# Patient Record
Sex: Female | Born: 1955 | Smoking: Never smoker
Health system: Southern US, Community
[De-identification: ages and names within clinical notes are randomized; demographics above are authoritative.]

## PROBLEM LIST (undated history)

## (undated) DIAGNOSIS — K859 Acute pancreatitis without necrosis or infection, unspecified: Secondary | ICD-10-CM

## (undated) HISTORY — PX: BREAST BIOPSY: SHX20

## (undated) HISTORY — DX: Acute pancreatitis without necrosis or infection, unspecified: K85.90

---

## 2015-04-01 HISTORY — PX: REDUCTION MAMMAPLASTY: SUR839

## 2016-10-20 ENCOUNTER — Other Ambulatory Visit (HOSPITAL_COMMUNITY)
Admission: RE | Admit: 2016-10-20 | Discharge: 2016-10-20 | Disposition: A | Discharge status: Home / Self Care | Payer: BC Managed Care – PPO | Source: Ambulatory Visit | Attending: Obstetrics and Gynecology | Admitting: Obstetrics and Gynecology

## 2016-10-20 ENCOUNTER — Other Ambulatory Visit: Payer: Self-pay | Admitting: Obstetrics and Gynecology

## 2016-10-20 DIAGNOSIS — Z01419 Encounter for gynecological examination (general) (routine) without abnormal findings: Secondary | ICD-10-CM | POA: Insufficient documentation

## 2016-10-24 LAB — CYTOLOGY - PAP
DIAGNOSIS: NEGATIVE
HPV (WINDOPATH): NOT DETECTED

## 2016-10-31 ENCOUNTER — Other Ambulatory Visit: Payer: Self-pay | Admitting: Obstetrics and Gynecology

## 2016-10-31 DIAGNOSIS — Z1231 Encounter for screening mammogram for malignant neoplasm of breast: Secondary | ICD-10-CM

## 2016-11-20 ENCOUNTER — Ambulatory Visit
Admission: RE | Admit: 2016-11-20 | Discharge: 2016-11-20 | Disposition: A | Discharge status: Home / Self Care | Payer: BC Managed Care – PPO | Source: Ambulatory Visit | Attending: Obstetrics and Gynecology | Admitting: Obstetrics and Gynecology

## 2016-11-20 DIAGNOSIS — Z1231 Encounter for screening mammogram for malignant neoplasm of breast: Secondary | ICD-10-CM

## 2017-01-12 ENCOUNTER — Other Ambulatory Visit: Payer: Self-pay | Admitting: Internal Medicine

## 2017-01-12 DIAGNOSIS — R590 Localized enlarged lymph nodes: Secondary | ICD-10-CM

## 2017-01-12 DIAGNOSIS — E041 Nontoxic single thyroid nodule: Secondary | ICD-10-CM

## 2017-01-16 ENCOUNTER — Ambulatory Visit
Admission: RE | Admit: 2017-01-16 | Discharge: 2017-01-16 | Disposition: A | Discharge status: Home / Self Care | Payer: BC Managed Care – PPO | Source: Ambulatory Visit | Attending: Internal Medicine | Admitting: Internal Medicine

## 2017-01-16 DIAGNOSIS — R590 Localized enlarged lymph nodes: Secondary | ICD-10-CM

## 2017-01-16 DIAGNOSIS — E041 Nontoxic single thyroid nodule: Secondary | ICD-10-CM

## 2017-01-19 ENCOUNTER — Other Ambulatory Visit: Payer: Self-pay | Admitting: Internal Medicine

## 2017-01-19 DIAGNOSIS — R590 Localized enlarged lymph nodes: Secondary | ICD-10-CM

## 2017-01-22 ENCOUNTER — Other Ambulatory Visit: Payer: Self-pay | Admitting: Internal Medicine

## 2017-01-22 ENCOUNTER — Ambulatory Visit
Admission: RE | Admit: 2017-01-22 | Discharge: 2017-01-22 | Disposition: A | Discharge status: Home / Self Care | Payer: BC Managed Care – PPO | Source: Ambulatory Visit | Attending: Internal Medicine | Admitting: Internal Medicine

## 2017-01-22 DIAGNOSIS — R590 Localized enlarged lymph nodes: Secondary | ICD-10-CM

## 2017-02-25 ENCOUNTER — Other Ambulatory Visit: Payer: Self-pay

## 2017-02-25 ENCOUNTER — Institutional Professional Consult (permissible substitution): Payer: BC Managed Care – PPO | Admitting: Surgery

## 2017-02-25 ENCOUNTER — Encounter: Payer: Self-pay | Admitting: Surgery

## 2017-02-25 ENCOUNTER — Other Ambulatory Visit: Payer: Self-pay | Admitting: *Deleted

## 2017-02-25 VITALS — BP 136/79 | HR 54 | Ht 65.0 in | Wt 290.0 lb

## 2017-02-25 DIAGNOSIS — I712 Thoracic aortic aneurysm, without rupture, unspecified: Secondary | ICD-10-CM

## 2017-03-01 ENCOUNTER — Encounter: Payer: Self-pay | Admitting: Surgery

## 2017-03-01 NOTE — Progress Notes (Signed)
Cardiothoracic Surgery Consultation   PCP is Schoenhoff, Harrington Challengereborah D, MD Referring Provider is Constance GoltzSchoenhoff, Harrington Challengereborah D, *  Chief Complaint  Patient presents with  . 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 A CXR shows a possible right lung nodule at the base and a chest CT was done which showed a small 4 mm RUL lung nodule. There was no mention of an aortic aneurysm in that report but I do have a report from 2015 and the CT at that time showed a 4 cm fusiform ascending aortic aneurysm that reportedly was unchanged compared to a CT of the chest in 2014. The RUL lung nodule was stable and benign. There was also a thyroid nodule that was biopsied and reportedly benign. She recently established care with a PCP here and was referred to me to follow this aneurysm. There is no family history of bicuspid aortic valve disease, connective tissue disorder, aortic aneurysm or aortic dissection.   Past Medical History:  Diagnosis Date  . Pancreatitis     Past Surgical History:  Procedure Laterality Date  . BREAST BIOPSY Left   . REDUCTION MAMMAPLASTY Bilateral 2017    Family History  Problem Relation Age of Onset  . Breast cancer Maternal Grandmother   . Hypertension Mother   . GER disease Mother   . Hyperlipidemia Mother   . Hypertension Father   . Hyperlipidemia Father   . Heart disease Father     Social History Social History   Tobacco Use  . Smoking status: Never Smoker  . Smokeless tobacco: Never Used  Substance Use Topics  . Alcohol use: No    Frequency: Never  . Drug use: No    Current Outpatient Medications  Medication Sig Dispense Refill  . Ascorbic Acid (VITAMIN C PO) Take by mouth daily.    . Cholecalciferol (VITAMIN D PO) Take by mouth daily.    . Multiple Vitamin  (MULTIVITAMIN) tablet Take 1 tablet by mouth daily.    Marland Kitchen. POTASSIUM PO Take by mouth as needed.     No current facility-administered medications for this visit.     Allergies  Allergen Reactions  . Advil [Ibuprofen]     History of pancreatitis    Review of Systems  Constitutional: Negative for activity change, appetite change, fatigue and fever.       Weight gain  HENT: Negative.   Eyes: Negative.        Wears glasses  Respiratory: Negative for cough, chest tightness and shortness of breath.   Cardiovascular: Negative for chest pain and leg swelling.  Gastrointestinal: Negative.   Endocrine: Negative.   Genitourinary: Negative.   Musculoskeletal: Positive for arthralgias.  Skin: Negative.   Allergic/Immunologic: Negative.   Neurological: Negative.   Hematological: Negative.   Psychiatric/Behavioral: Negative.     BP 136/79   Pulse (!) 54   Ht 5\' 5"  (1.651 m)   Wt 290 lb (131.5 kg)   SpO2 98%   BMI 48.26 kg/m  Physical Exam  Constitutional: She is oriented to person, place, and time. She appears well-developed and well-nourished. No distress.  HENT:  Head: Normocephalic and atraumatic.  Mouth/Throat: Oropharynx is clear and moist.  Eyes: Conjunctivae and EOM are normal. Pupils are equal, round, and reactive  to light.  Neck: Normal range of motion. Neck supple. No JVD present. No thyromegaly present.  Cardiovascular: Normal rate, regular rhythm, normal heart sounds and intact distal pulses.  No murmur heard. Pulmonary/Chest: Effort normal and breath sounds normal. No respiratory distress.  Abdominal: Soft. Bowel sounds are normal. She exhibits no distension. There is no tenderness.  Musculoskeletal: Normal range of motion. She exhibits no edema.  Lymphadenopathy:    She has no cervical adenopathy.  Neurological: She is alert and oriented to person, place, and time.  Skin: Skin is warm and dry.  Psychiatric: She has a normal mood and affect.     Diagnostic  Tests:  CT reports [facility] from [facility], 2015 reviewed. She has not had a CT scan here.  Impression:  She has a history of a stable 4.0 cm fusiform ascending aortic aneurysm that was last checked on CT in 2015. I think it is reasonable to repeat the CTA of the chest at this time to reassess this. 4 cm is well below the usual surgical threshold of 5.5 cm but this should be followed periodically because it may enlarge. I discussed the importance of good BP control in preventing enlargement and aortic dissection. All of her questions have been answered.  Plan:  I will order a CTA of the chest and will then let her know the results of that and set up a surveillance schedule depending on that study.  I spent 45 minutes performing this consultation and > 50% of this time was spent face to face counseling and coordinating the care of this patient's aortic aneurysm.  Alleen BorneBryan K Caspian Deleonardis, MD [facility] [phone]

## 2017-03-05 ENCOUNTER — Encounter: Payer: Self-pay | Admitting: *Deleted

## 2017-04-01 ENCOUNTER — Other Ambulatory Visit: Payer: BC Managed Care – PPO

## 2017-04-01 ENCOUNTER — Encounter: Payer: BC Managed Care – PPO | Admitting: Surgery

## 2017-04-03 ENCOUNTER — Other Ambulatory Visit: Payer: BC Managed Care – PPO

## 2017-04-06 ENCOUNTER — Ambulatory Visit
Admission: RE | Admit: 2017-04-06 | Discharge: 2017-04-06 | Disposition: A | Discharge status: Home / Self Care | Payer: BC Managed Care – PPO | Source: Ambulatory Visit | Attending: Surgery | Admitting: Surgery

## 2017-04-06 DIAGNOSIS — I712 Thoracic aortic aneurysm, without rupture, unspecified: Secondary | ICD-10-CM

## 2017-04-06 MED ORDER — IOPAMIDOL (ISOVUE-370) INJECTION 76%
75.0000 mL | Freq: Once | INTRAVENOUS | Status: AC | PRN
  Administered 2017-04-06: 75 mL via INTRAVENOUS

## 2017-04-22 ENCOUNTER — Ambulatory Visit (INDEPENDENT_AMBULATORY_CARE_PROVIDER_SITE_OTHER): Payer: BC Managed Care – PPO | Admitting: Surgery

## 2017-04-22 ENCOUNTER — Encounter: Payer: Self-pay | Admitting: Surgery

## 2017-04-22 ENCOUNTER — Encounter: Payer: BC Managed Care – PPO | Admitting: Surgery

## 2017-04-22 VITALS — BP 140/71 | HR 60 | Resp 20 | Ht 65.0 in

## 2017-04-22 DIAGNOSIS — I712 Thoracic aortic aneurysm, without rupture, unspecified: Secondary | ICD-10-CM

## 2017-04-22 NOTE — Progress Notes (Signed)
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 A CXR shows a possible right lung nodule at the base and a chest CT was done which showed a small 4 mm RUL lung nodule. There was no mention of an aortic aneurysm in that report but I do have a report from 2015 and the CT at that time showed a 4 cm fusiform ascending aortic aneurysm that reportedly was unchanged compared to a CT of the chest in 2014. The RUL lung nodule was stable and benign. There was also a thyroid nodule that was biopsied and reportedly benign. She recently established care with a PCP here and was referred to me to follow this aneurysm.  I saw her in the office initially on 02/25/2017 and recommended that we do a repeat CT scan to reevaluate the a sending aortic aneurysm since it had not been checked since 2015.  There is no family history of bicuspid aortic valve disease, connective tissue disorder, aortic aneurysm or aortic dissection.     Current Outpatient Medications  Medication Sig Dispense Refill  . Ascorbic Acid (VITAMIN C PO) Take by mouth daily.    . Cholecalciferol (VITAMIN D PO) Take by mouth daily.    . Multiple Vitamin (MULTIVITAMIN) tablet Take 1 tablet by mouth daily.    Marland Kitchen POTASSIUM PO Take by mouth as needed.     No current facility-administered medications for this visit.      Physical Exam: BP 140/71   Pulse 60   Resp 20   Ht 5\' 5"  (1.651 m)   SpO2 97% Comment: RA  BMI 48.26 kg/m    Diagnostic Tests:  CLINICAL DATA:  History of aneurysmal disease of the thoracic aorta. Prior imaging was performed in [place] and is unavailable for comparison.  EXAM: CT ANGIOGRAPHY CHEST WITH CONTRAST  TECHNIQUE: Multidetector CT imaging of the chest was performed using the standard protocol during bolus administration of intravenous contrast. Multiplanar CT image  reconstructions and MIPs were obtained to evaluate the vascular anatomy.  CONTRAST:  75mL ISOVUE-370 IOPAMIDOL (ISOVUE-370) INJECTION 76%  Creatinine was obtained on site at [facility] at [address]  Results: Creatinine 0.6 mg/dL.  Estimated GFR 97 mL/minute.  COMPARISON:  None.  FINDINGS: Cardiovascular: The aortic root measures 3.5 cm at the sinuses of Valsalva. The ascending thoracic aorta measures 3.8 cm in greatest diameter. The proximal arch measures 3.3 cm. The distal arch measures 2.8 cm. The descending thoracic aorta measures 2.6 cm. No significant atherosclerosis or evidence of dissection. The aortic valve is not grossly calcified by CT. Proximal great vessels are normally patent and demonstrate normal branching anatomy.  The heart size is normal. No pericardial fluid identified. No calcified coronary arterial plaque is identified by CT. Central pulmonary arteries are normal in caliber.  Mediastinum/Nodes: No evidence of mediastinal, hilar or axillary lymphadenopathy. No mediastinal masses. Visualized lower neck/ upper mediastinum demonstrates possible nodularity in superior and inferior aspects of the left lobe of the thyroid gland. The left lobe also appears slightly larger than the right. No trachea or esophageal abnormalities.  Lungs/Pleura: There is no evidence of pulmonary edema, consolidation, pneumothorax, nodule or pleural fluid. Bandlike area of opacity in the lingula likely represents scarring/atelectasis.  Upper Abdomen: Prior cholecystectomy.  Probable small hiatal hernia.  Musculoskeletal: No chest wall abnormality.  No acute or significant osseous findings.  Review of the MIP images confirms the above findings.  IMPRESSION: 1. The thoracic aorta is not overtly aneurysmal with maximum ascending thoracic aortic caliber of 3.8 cm. No significant atherosclerosis identified. 2. Incidental possible nodules in the left lobe  of the thyroid gland which are ill-defined by CT. Recommend correlation with elective thyroid ultrasound.   Electronically Signed   By: Irish LackGlenn  Yamagata M.D.   On: 04/06/2017 16:09   Impression:  The maximum diameter of the ascending study is 3.8 cm which is unchanged from her previous CT scan in 2015 when it was 4.0 cm.  I recommended repeating her scan in about 2 years to reassess this and if it is unchanged at that time and the follow-up could be spaced out even more.  I reviewed the CT scan images with her and asked  Plan:  Follow-up in 2 years with a CTA of the chest.   I spent 10 minutes performing this established patient evaluation and > 50% of this time was spent face to face counseling and coordinating the care of this patient's aortic aneurysm.    Alleen BorneBryan K Kameko Hukill, MD [facility] [phone]

## 2017-05-25 ENCOUNTER — Ambulatory Visit: Payer: BC Managed Care – PPO | Admitting: Endocrinology

## 2017-05-25 ENCOUNTER — Encounter: Payer: Self-pay | Admitting: Endocrinology

## 2017-05-25 DIAGNOSIS — E042 Nontoxic multinodular goiter: Secondary | ICD-10-CM | POA: Diagnosis not present

## 2017-05-25 NOTE — Progress Notes (Signed)
Subjective:    Patient ID: Kimberly Clark, female    DOB: 11-Nov-1955, 62 y.o.   MRN: [id number]  HPI Pt is referred by Dr Jodean LimaScheonhoff, for nodular thyroid.  Pt was noted to have a nodule at the thyroid in 2016, when she lived in [place].  she has no h/o XRT or surgery to the neck.  She says bx was benign.  She has slight swelling at the ant neck, but no assoc pain. Past Medical History:  Diagnosis Date  . Pancreatitis     Past Surgical History:  Procedure Laterality Date  . BREAST BIOPSY Left   . REDUCTION MAMMAPLASTY Bilateral 2017    Social History   Socioeconomic History  . Marital status: Unknown    Spouse name: Not on file  . Number of children: Not on file  . Years of education: Not on file  . Highest education level: Not on file  Social Needs  . Financial resource strain: Not on file  . Food insecurity - worry: Not on file  . Food insecurity - inability: Not on file  . Transportation needs - medical: Not on file  . Transportation needs - non-medical: Not on file  Occupational History  . Not on file  Tobacco Use  . Smoking status: Never Smoker  . Smokeless tobacco: Never Used  Substance and Sexual Activity  . Alcohol use: No    Frequency: Never  . Drug use: No  . Sexual activity: No    Partners: Male  Other Topics Concern  . Not on file  Social History Narrative  . Not on file    Current Outpatient Medications on File Prior to Visit  Medication Sig Dispense Refill  . Acetaminophen (TYLENOL EXTRA STRENGTH PO) Take 500 mg by mouth every 6 (six) hours as needed.    . Ascorbic Acid (VITAMIN C PO) Take by mouth daily.    . Cholecalciferol (VITAMIN D PO) Take by mouth daily.    . Multiple Vitamin (MULTIVITAMIN) tablet Take 1 tablet by mouth daily.    Marland Kitchen. POTASSIUM PO Take by mouth as needed.     No current facility-administered medications on file prior to visit.     Allergies  Allergen Reactions  . Advil [Ibuprofen]     History of pancreatitis    Family  History  Problem Relation Age of Onset  . Breast cancer Maternal Grandmother   . Hypertension Mother   . GER disease Mother   . Hyperlipidemia Mother   . Hypertension Father   . Hyperlipidemia Father   . Heart disease Father     BP 122/84   Pulse 74   Ht 5\' 5"  (1.651 m)   Wt 293 lb 3.2 oz (133 kg)   BMI 48.79 kg/m    Review of Systems Denies weight change, hoarseness, visual loss, chest pain, sob, cough, dysphagia, diarrhea, itching, flushing, easy bruising, depression, cold intolerance, headache, numbness, and rhinorrhea.       Objective:   Physical Exam VS: see vs page GEN: no distress HEAD: head: no deformity eyes: no periorbital swelling, no proptosis external nose and ears are normal mouth: no lesion seen NECK: supple, thyroid is not enlarged.  I cannot appreciate the nodules.   CHEST WALL: no deformity LUNGS: clear to auscultation CV: reg rate and rhythm, no murmur ABD: abdomen is soft, nontender.  no hepatosplenomegaly.  not distended.  no hernia MUSCULOSKELETAL: muscle bulk and strength are grossly normal.  no obvious joint swelling.  gait is  normal and steady EXTEMITIES: no deformity.  no ulcer on the feet.  feet are of normal color and temp.  no edema PULSES: dorsalis pedis intact bilat.  no carotid bruit NEURO:  cn 2-12 grossly intact.   readily moves all 4's.  sensation is intact to touch on the feet SKIN:  Normal texture and temperature.  No rash or suspicious lesion is visible.   NODES:  None palpable at the neck.  PSYCH: alert, well-oriented.  Does not appear anxious nor depressed.    [place]: mildly enlarged and diffusely heterogeneous multinodular thyroid gland most consistent with multinodular goiter.  The image nodules are either small, or sonographically benign in appearance and do not require any further follow-up.   Pt signs release of information from bx in [place]  outside test results are reviewed: TSH=1.6     Assessment & Plan:  multinodular  goiter, new to me.  [place] is low-risk.  Euthyroid.    Patient Instructions  Dr Ebbie Latus will examine your neck, and check the blood test each year. I would be happy to see you back here as needed

## 2017-05-25 NOTE — Patient Instructions (Addendum)
Dr Ebbie LatusSchoenoff will examine your neck, and check the blood test each year. I would be happy to see you back here as needed.

## 2017-05-27 DIAGNOSIS — E042 Nontoxic multinodular goiter: Secondary | ICD-10-CM | POA: Insufficient documentation

## 2017-05-28 ENCOUNTER — Telehealth: Payer: Self-pay | Admitting: Endocrinology

## 2017-05-29 NOTE — Telephone Encounter (Signed)
error 

## 2017-11-06 ENCOUNTER — Other Ambulatory Visit: Payer: Self-pay | Admitting: Obstetrics and Gynecology

## 2017-11-06 DIAGNOSIS — Z1231 Encounter for screening mammogram for malignant neoplasm of breast: Secondary | ICD-10-CM

## 2017-12-08 ENCOUNTER — Ambulatory Visit
Admission: RE | Admit: 2017-12-08 | Discharge: 2017-12-08 | Disposition: A | Discharge status: Home / Self Care | Payer: BC Managed Care – PPO | Source: Ambulatory Visit | Attending: Obstetrics and Gynecology | Admitting: Obstetrics and Gynecology

## 2017-12-08 DIAGNOSIS — Z1231 Encounter for screening mammogram for malignant neoplasm of breast: Secondary | ICD-10-CM

## 2018-11-02 ENCOUNTER — Other Ambulatory Visit: Payer: Self-pay | Admitting: Obstetrics and Gynecology

## 2018-11-02 DIAGNOSIS — Z1231 Encounter for screening mammogram for malignant neoplasm of breast: Secondary | ICD-10-CM

## 2018-12-21 ENCOUNTER — Ambulatory Visit: Payer: BC Managed Care – PPO

## 2019-01-10 ENCOUNTER — Ambulatory Visit: Payer: BC Managed Care – PPO

## 2019-03-15 ENCOUNTER — Other Ambulatory Visit: Payer: Self-pay | Admitting: *Deleted

## 2019-03-15 DIAGNOSIS — I712 Thoracic aortic aneurysm, without rupture, unspecified: Secondary | ICD-10-CM

## 2019-04-13 ENCOUNTER — Encounter: Payer: BC Managed Care – PPO | Admitting: Surgery

## 2019-04-13 ENCOUNTER — Other Ambulatory Visit: Payer: BC Managed Care – PPO

## 2019-04-27 ENCOUNTER — Encounter: Payer: BC Managed Care – PPO | Admitting: Surgery

## 2019-06-18 IMAGING — US US THYROID
1 series · 14 of 25 positions shown · non-contrast
Comparison: Thyroid ultrasound on 01/16/2017

CLINICAL DATA: Cervical lymphadenopathy.

EXAM:
ULTRASOUND OF HEAD/NECK SOFT TISSUES
TECHNIQUE: Ultrasound examination of the head and neck soft tissues was
performed in the area of clinical concern.

[Series 1: us thyroid · 0.04mm/px · 14 of 53 slices shown]
[im 1/53]
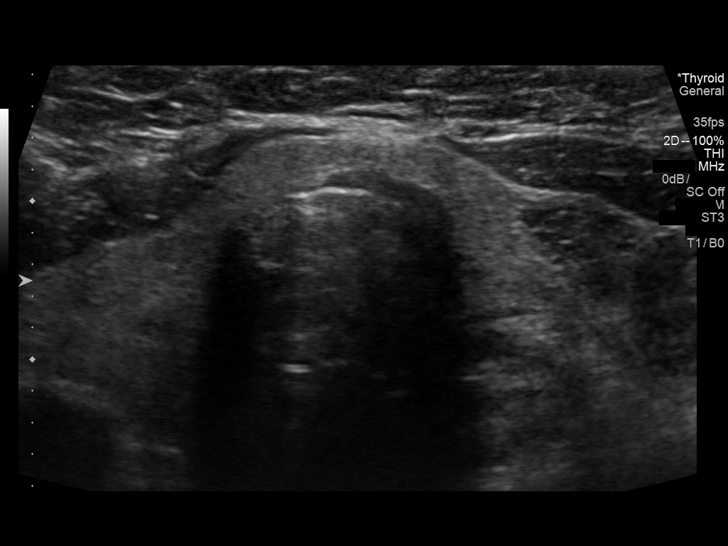
[im 5/53]
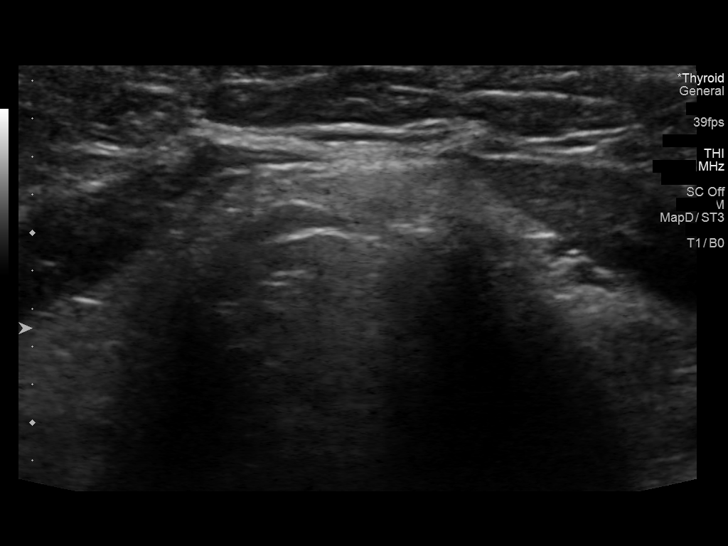
[im 9/53]
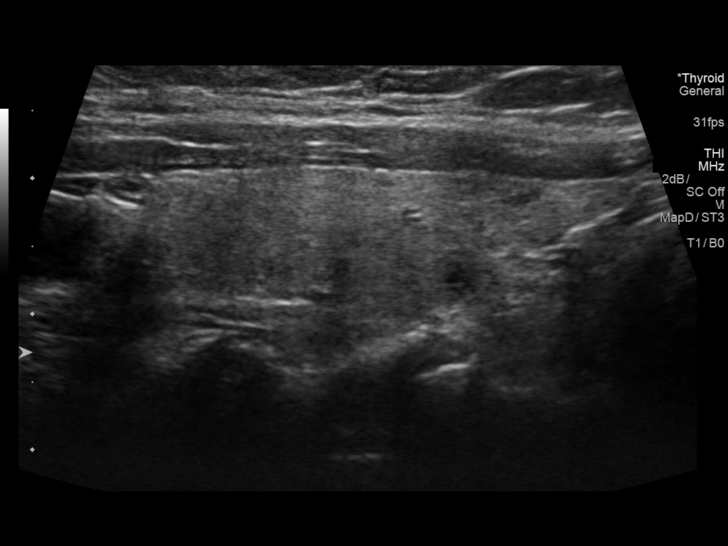
[im 14/53]
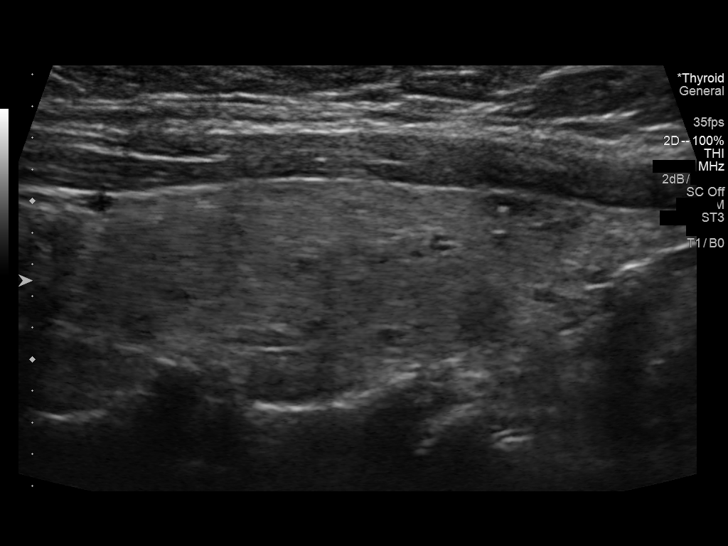
[im 18/53]
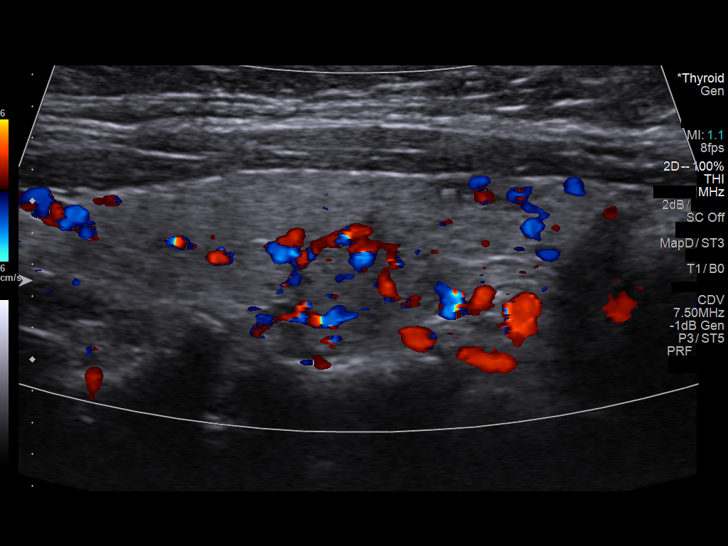
[im 20/53]
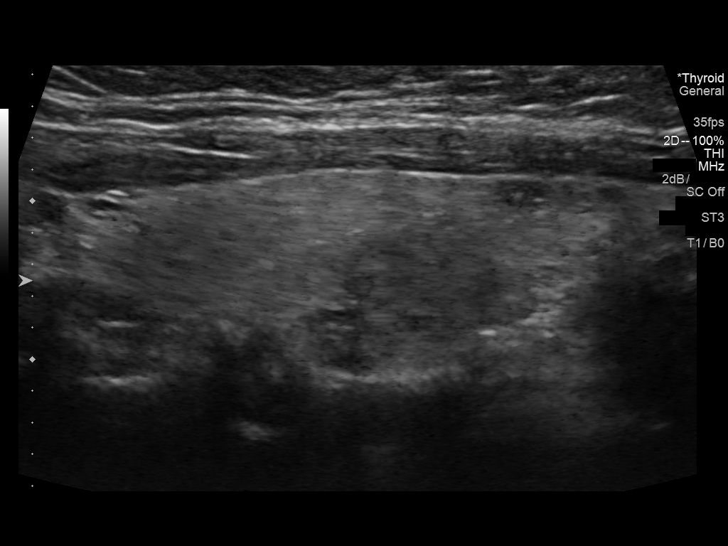
[im 24/53]
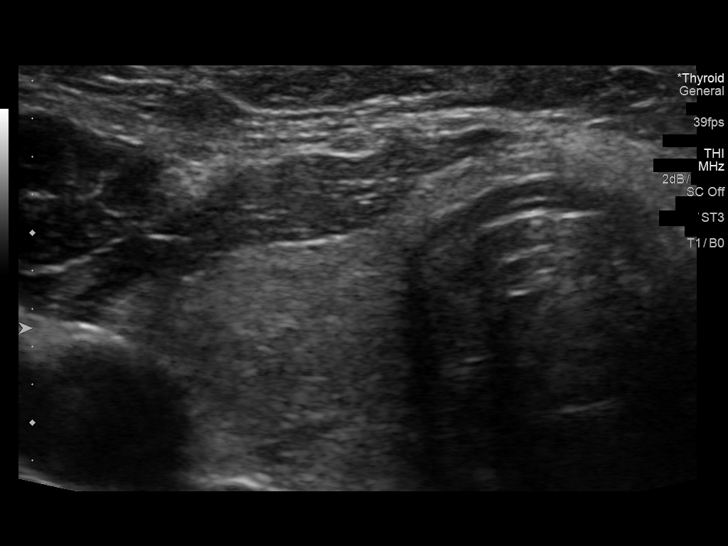
[im 29/53]
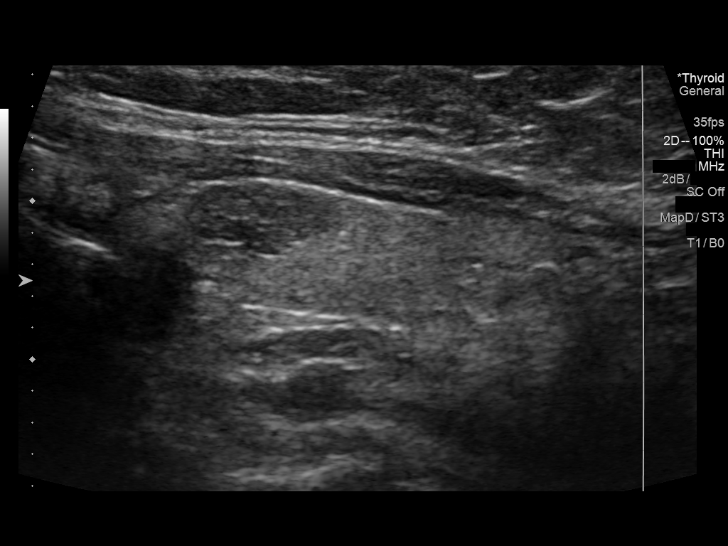
[im 33/53]
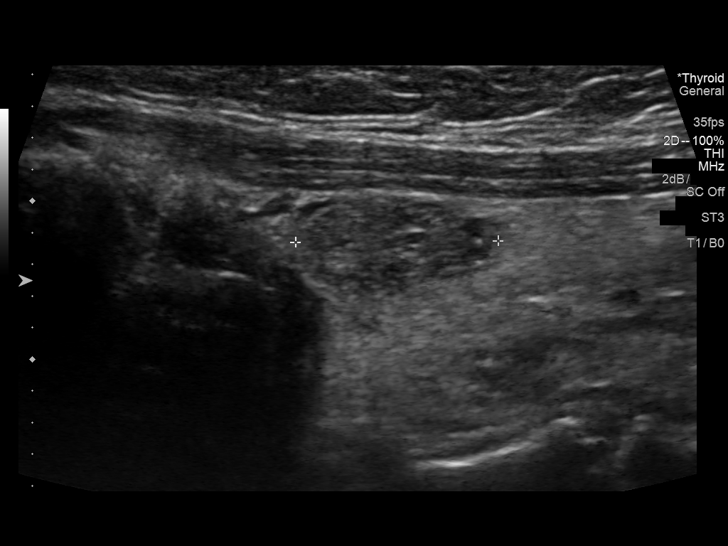
[im 35/53]
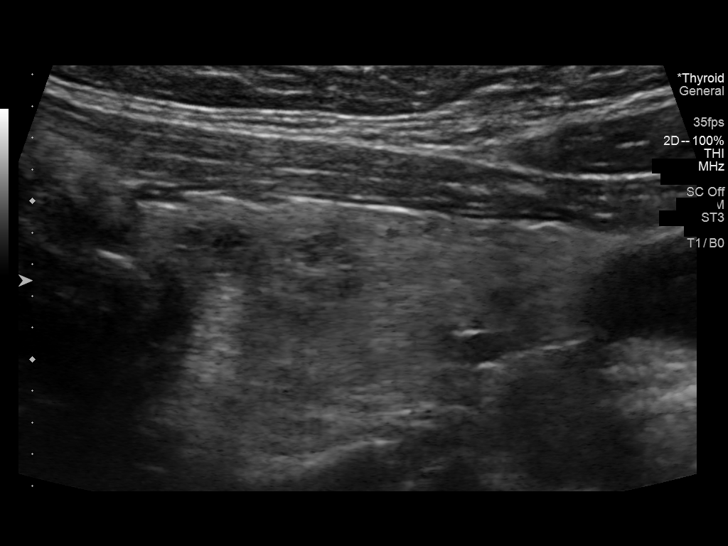
[im 40/53]
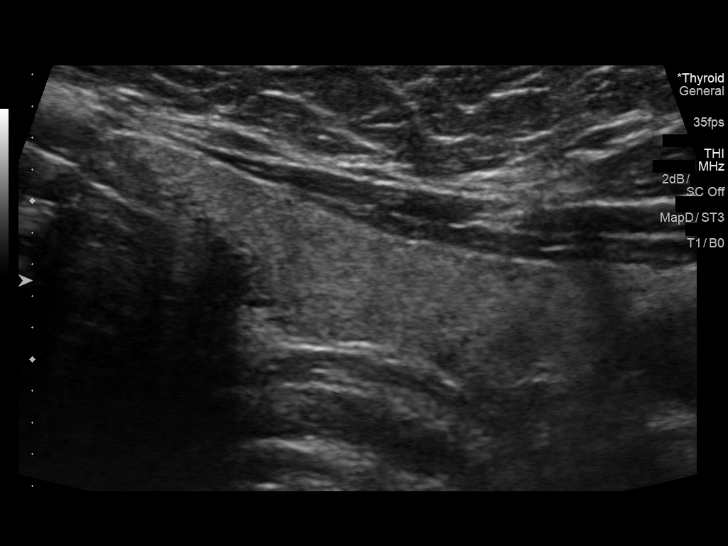
[im 44/53]
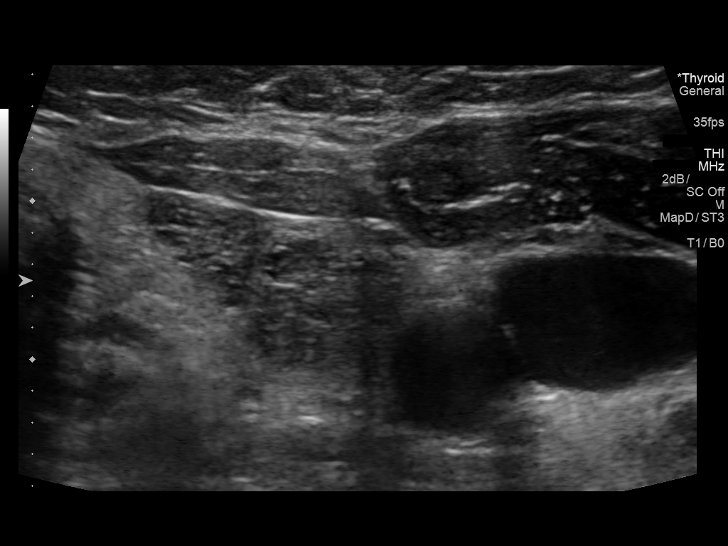
[im 48/53]
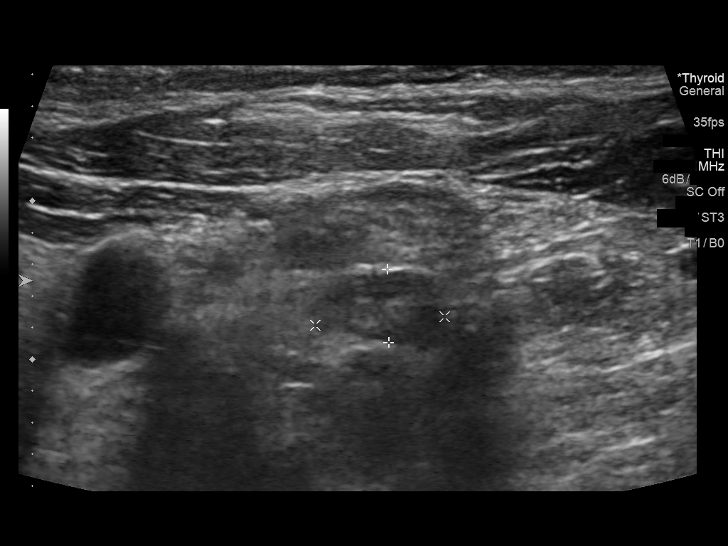
[im 53/53]
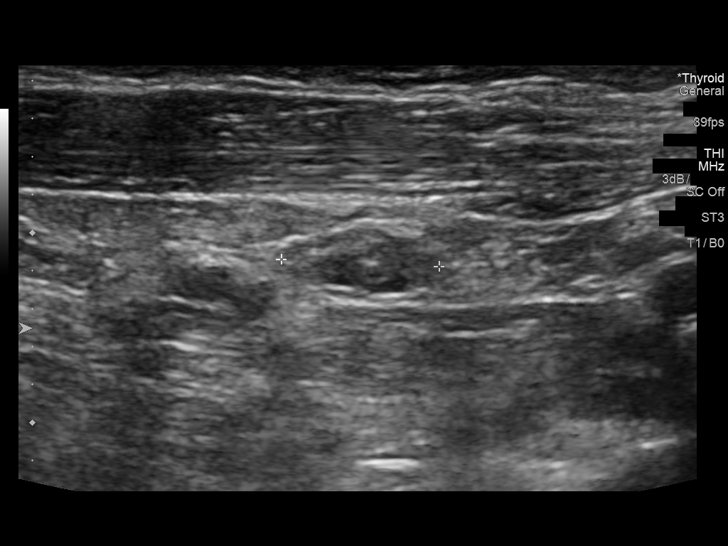

[14 of 25 positions shown; findings below may reference images not displayed]

FINDINGS: There are no enlarged lymph nodes identified in the neck by
ultrasound. Several imaged nodes measure between 0.3 cm and 0.5 cm
in short axis. This is not an exhaustive survey of the entire neck
and if there is clinically enlarging lymphadenopathy by exam, CT of
the neck with IV contrast is recommended.
IMPRESSION: No enlarged cervical lymph nodes identified by ultrasound. If there
are clinically enlarging palpable lymph nodes by exam, CT of the
neck with IV contrast is recommended.

## 2019-11-08 IMAGING — CT CT ANGIO CHEST
3 of 6 series · 18 of 46 positions shown · IV contrast (APPLIED)
Comparison: None.

CLINICAL DATA: History of aneurysmal disease of the thoracic aorta.
Prior imaging was performed in [place] and is unavailable for
comparison.

EXAM:
CT ANGIOGRAPHY CHEST WITH CONTRAST
TECHNIQUE: Multidetector CT imaging of the chest was performed using the
standard protocol during bolus administration of intravenous
contrast. Multiplanar CT image reconstructions and MIPs were
obtained to evaluate the vascular anatomy.
CONTRAST:  75mL ITWYRD-PZ1 IOPAMIDOL (ITWYRD-PZ1) INJECTION 76%
Creatinine was obtained on site at [HOSPITAL] at [HOSPITAL].
Results: Creatinine 0.6 mg/dL.  Estimated GFR 97 mL/minute.

[Series 4: chest angio · axial · 0.70mm/px · z∈[-190,+65]mm · 12 of 101 slices shown]
[im 8/101  lung]
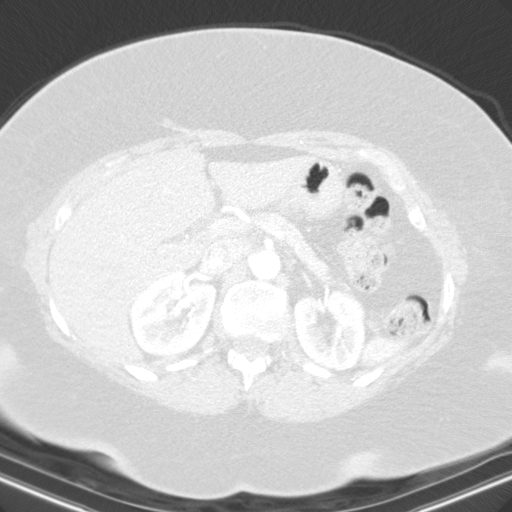
[im 16/101  soft-tissue]
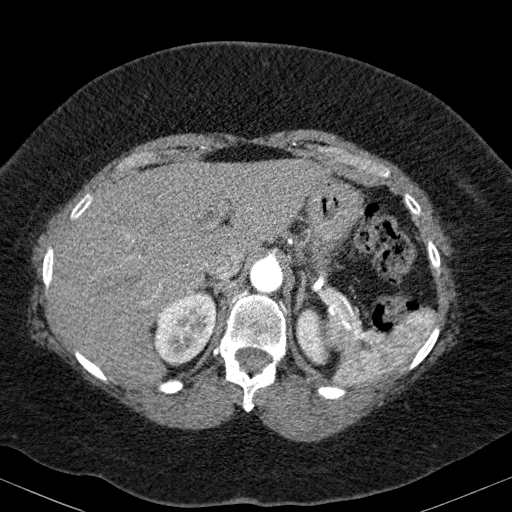
[im 24/101  lung]
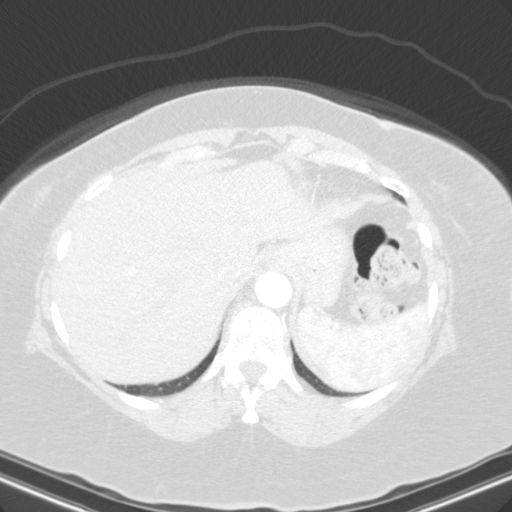
[im 31/101  soft-tissue]
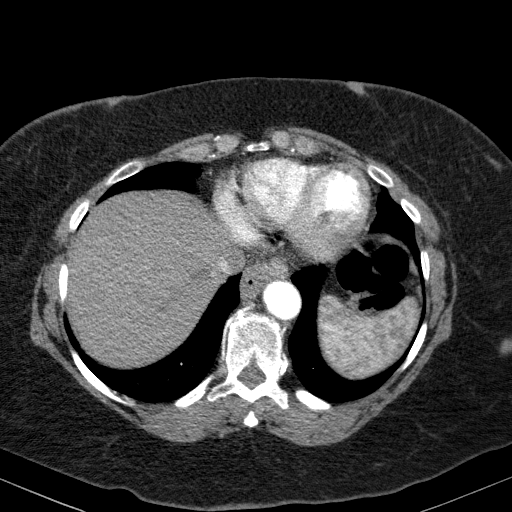
[im 39/101  lung]
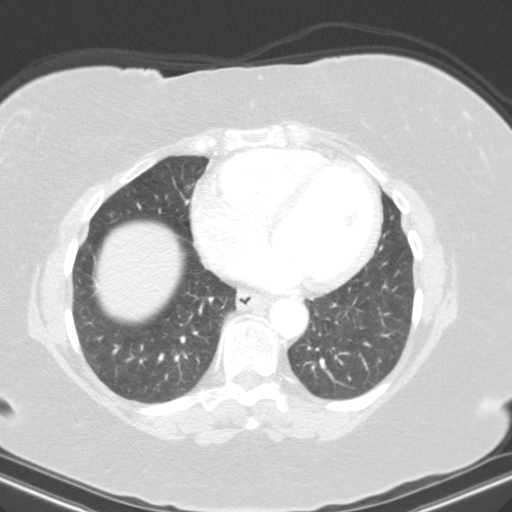
[im 47/101  soft-tissue]
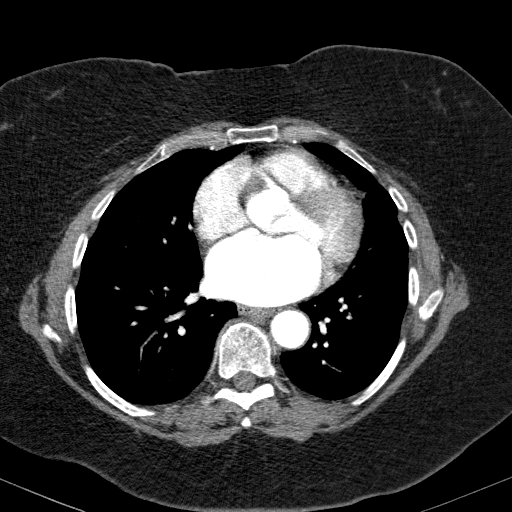
[im 54/101  lung]
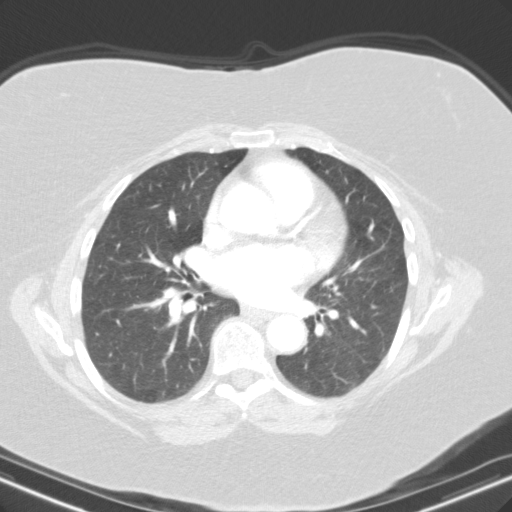
[im 62/101  soft-tissue]
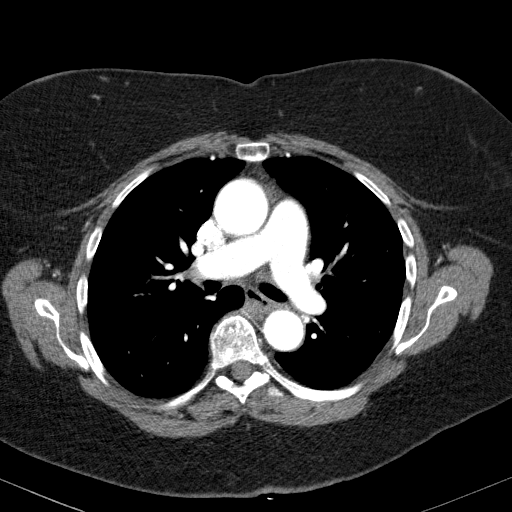
[im 70/101  lung]
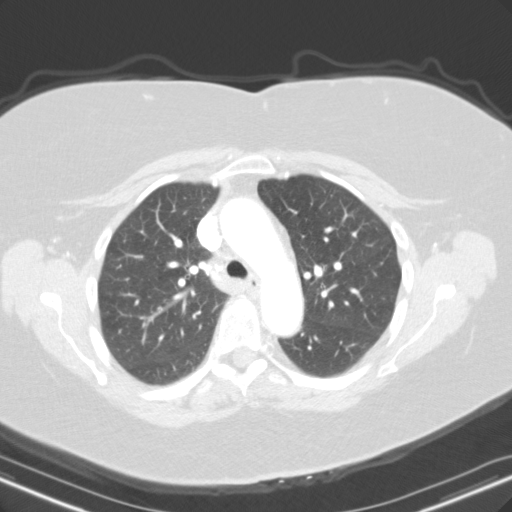
[im 77/101  soft-tissue]
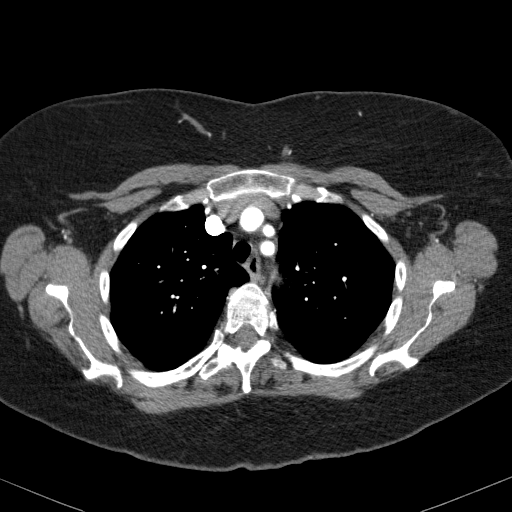
[im 85/101  lung]
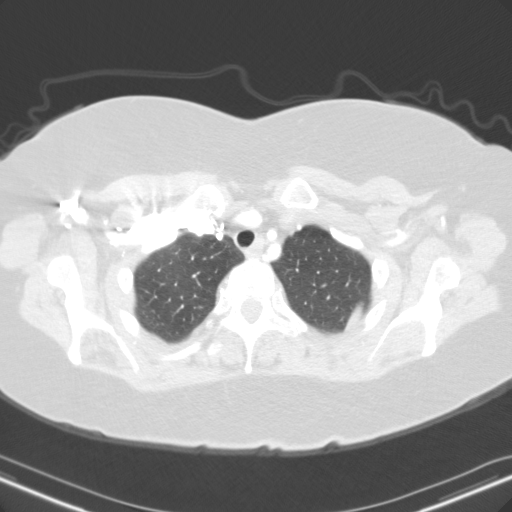
[im 93/101  soft-tissue]
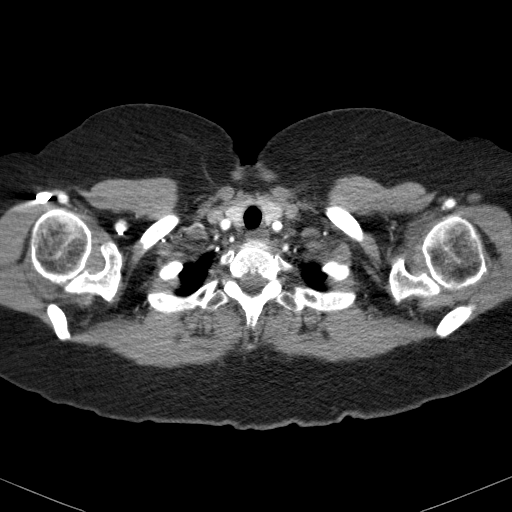

[Series 5: chest angio cor · coronal · 0.59mm/px · 3 of 155 slices shown]
[im 39/155  soft-tissue]
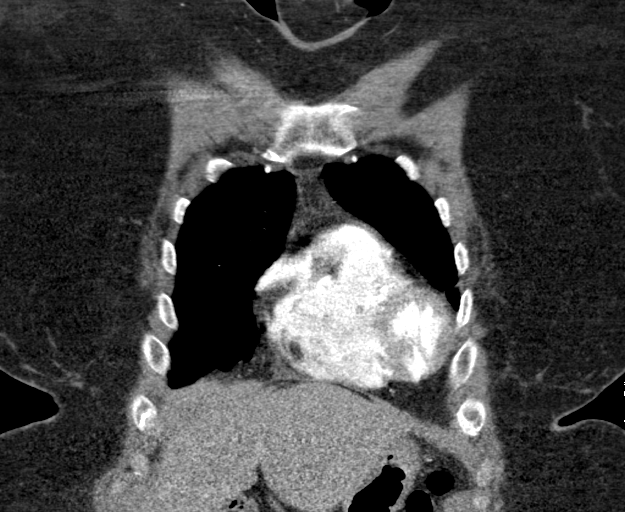
[im 78/155  soft-tissue]
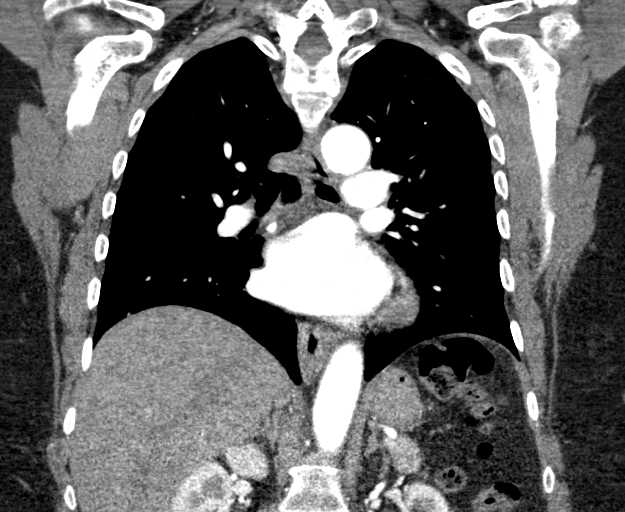
[im 116/155  soft-tissue]
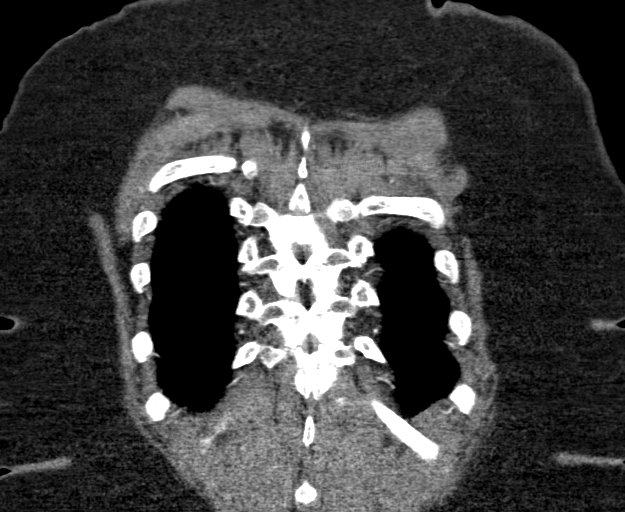

[Series 11: lung · axial · 0.70mm/px · z∈[-135,-75]mm · 3 of 127 slices shown]
[im 15/127  soft-tissue]
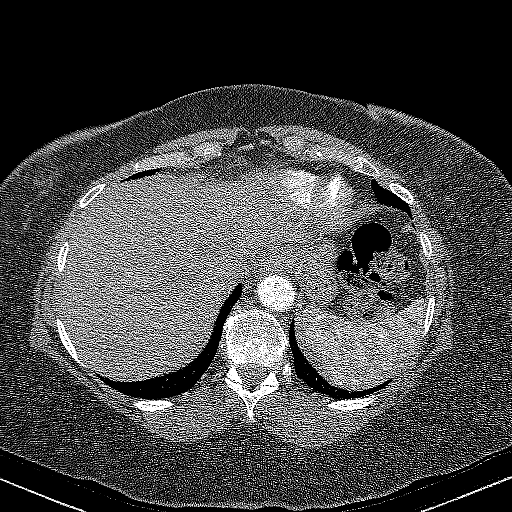
[im 30/127  soft-tissue]
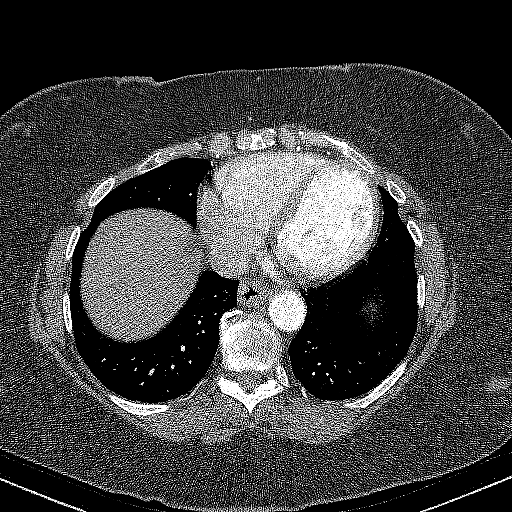
[im 45/127  soft-tissue]
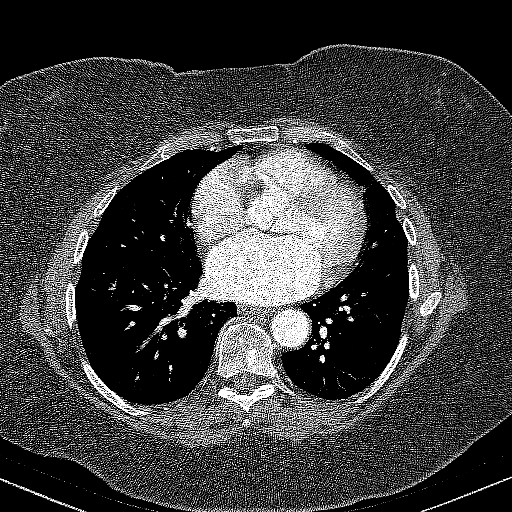

[18 of 46 positions shown; findings below may reference images not displayed]

FINDINGS: Cardiovascular: The aortic root measures 3.5 cm at the sinuses of
Valsalva. The ascending thoracic aorta measures 3.8 cm in greatest
diameter. The proximal arch measures 3.3 cm. The distal arch
measures 2.8 cm. The descending thoracic aorta measures 2.6 cm. No
significant atherosclerosis or evidence of dissection. The aortic
valve is not grossly calcified by CT. Proximal great vessels are
normally patent and demonstrate normal branching anatomy.

The heart size is normal. No pericardial fluid identified. No
calcified coronary arterial plaque is identified by CT. Central
pulmonary arteries are normal in caliber.

Mediastinum/Nodes: No evidence of mediastinal, hilar or axillary
lymphadenopathy. No mediastinal masses. Visualized lower neck/ upper
mediastinum demonstrates possible nodularity in superior and
inferior aspects of the left lobe of the thyroid gland. The left
lobe also appears slightly larger than the right. No trachea or
esophageal abnormalities.

Lungs/Pleura: There is no evidence of pulmonary edema,
consolidation, pneumothorax, nodule or pleural fluid. Bandlike area
of opacity in the lingula likely represents scarring/atelectasis.

Upper Abdomen: Prior cholecystectomy.  Probable small hiatal hernia.

Musculoskeletal: No chest wall abnormality. No acute or significant
osseous findings.

Review of the MIP images confirms the above findings.
IMPRESSION: 1. The thoracic aorta is not overtly aneurysmal with maximum
ascending thoracic aortic caliber of 3.8 cm. No significant
atherosclerosis identified.
2. Incidental possible nodules in the left lobe of the thyroid gland
which are ill-defined by CT. Recommend correlation with elective
thyroid ultrasound.
